# Patient Record
Sex: Male | Born: 1985 | Hispanic: No | Marital: Single | Smoking: Current every day smoker
Health system: Southern US, Community
[De-identification: ages and names within clinical notes are randomized; demographics above are authoritative.]

---

## 2016-04-16 ENCOUNTER — Encounter (HOSPITAL_BASED_OUTPATIENT_CLINIC_OR_DEPARTMENT_OTHER): Payer: Self-pay | Admitting: *Deleted

## 2016-04-16 ENCOUNTER — Emergency Department (HOSPITAL_BASED_OUTPATIENT_CLINIC_OR_DEPARTMENT_OTHER)
Admission: EM | Admit: 2016-04-16 | Discharge: 2016-04-16 | Disposition: A | Discharge status: Home / Self Care | Payer: Managed Care, Other (non HMO) | Attending: Emergency Medicine | Admitting: Emergency Medicine

## 2016-04-16 ENCOUNTER — Emergency Department (HOSPITAL_BASED_OUTPATIENT_CLINIC_OR_DEPARTMENT_OTHER): Payer: Managed Care, Other (non HMO)

## 2016-04-16 DIAGNOSIS — F17200 Nicotine dependence, unspecified, uncomplicated: Secondary | ICD-10-CM | POA: Insufficient documentation

## 2016-04-16 DIAGNOSIS — R8299 Other abnormal findings in urine: Secondary | ICD-10-CM | POA: Insufficient documentation

## 2016-04-16 DIAGNOSIS — K6289 Other specified diseases of anus and rectum: Secondary | ICD-10-CM | POA: Diagnosis not present

## 2016-04-16 LAB — CBC WITH DIFFERENTIAL/PLATELET
BASOS ABS: 0 10*3/uL (ref 0.0–0.1)
Basophils Relative: 0 %
EOS PCT: 3 %
Eosinophils Absolute: 0.2 10*3/uL (ref 0.0–0.7)
HEMATOCRIT: 32.8 % — AB (ref 39.0–52.0)
HEMOGLOBIN: 11.4 g/dL — AB (ref 13.0–17.0)
LYMPHS ABS: 1.9 10*3/uL (ref 0.7–4.0)
LYMPHS PCT: 30 %
MCH: 20.4 pg — ABNORMAL LOW (ref 26.0–34.0)
MCHC: 34.8 g/dL (ref 30.0–36.0)
MCV: 58.8 fL — AB (ref 78.0–100.0)
Monocytes Absolute: 0.4 10*3/uL (ref 0.1–1.0)
Monocytes Relative: 7 %
NEUTROS ABS: 3.9 10*3/uL (ref 1.7–7.7)
Neutrophils Relative %: 60 %
Platelets: 175 10*3/uL (ref 150–400)
RBC: 5.58 MIL/uL (ref 4.22–5.81)
RDW: 17.8 % — AB (ref 11.5–15.5)
WBC: 6.4 10*3/uL (ref 4.0–10.5)

## 2016-04-16 LAB — URINALYSIS, ROUTINE W REFLEX MICROSCOPIC
Bilirubin Urine: NEGATIVE
GLUCOSE, UA: NEGATIVE mg/dL
Hgb urine dipstick: NEGATIVE
KETONES UR: NEGATIVE mg/dL
LEUKOCYTES UA: NEGATIVE
NITRITE: NEGATIVE
PH: 7 (ref 5.0–8.0)
Protein, ur: NEGATIVE mg/dL
SPECIFIC GRAVITY, URINE: 1.04 — AB (ref 1.005–1.030)

## 2016-04-16 LAB — BASIC METABOLIC PANEL
ANION GAP: 5 (ref 5–15)
BUN: 16 mg/dL (ref 6–20)
CO2: 28 mmol/L (ref 22–32)
Calcium: 8.7 mg/dL — ABNORMAL LOW (ref 8.9–10.3)
Chloride: 107 mmol/L (ref 101–111)
Creatinine, Ser: 1.04 mg/dL (ref 0.61–1.24)
GFR calc non Af Amer: >60 mL/min (ref >60)
GLUCOSE: 81 mg/dL (ref 65–99)
POTASSIUM: 3.6 mmol/L (ref 3.5–5.1)
Sodium: 140 mmol/L (ref 135–145)

## 2016-04-16 MED ORDER — CEFTRIAXONE SODIUM 250 MG IJ SOLR
250.0000 mg | Freq: Once | INTRAMUSCULAR | Status: AC
  Administered 2016-04-16: 250 mg via INTRAMUSCULAR
  Filled 2016-04-16: qty 250

## 2016-04-16 MED ORDER — IOPAMIDOL (ISOVUE-300) INJECTION 61%
100.0000 mL | Freq: Once | INTRAVENOUS | Status: AC | PRN
  Administered 2016-04-16: 100 mL via INTRAVENOUS

## 2016-04-16 MED ORDER — SODIUM CHLORIDE 0.9 % IV BOLUS (SEPSIS)
500.0000 mL | Freq: Once | INTRAVENOUS | Status: AC
  Administered 2016-04-16: 500 mL via INTRAVENOUS

## 2016-04-16 MED ORDER — AZITHROMYCIN 1 G PO PACK
1.0000 g | PACK | Freq: Once | ORAL | Status: AC
  Administered 2016-04-16: 1 g via ORAL
  Filled 2016-04-16: qty 1

## 2016-04-16 NOTE — ED Notes (Signed)
Patient transported to CT and returned 

## 2016-04-16 NOTE — ED Triage Notes (Signed)
States he can feel an enlarged lump inside is anus for the past week.

## 2016-04-16 NOTE — ED Notes (Signed)
Painful bump on buttocks

## 2016-04-16 NOTE — ED Provider Notes (Signed)
[facility] Note   CSN: [id number] Arrival date & time: 04/16/16  0865     History   Chief Complaint Chief Complaint  Patient presents with  . Rectal Pain    HPI Thomas Harmon is a 31 y.o. male.  HPI  Presents with concern for rectal pain for 1 week. Reports severe pain, worse with walking, sitting. Reports he feels an enlarged lump in his rectum.  Reports pain with BM.  Denies constipation, fevers, abdominal pain, nausea or vomiting.  No bloody stool or rectal bleeding.  No urinary symptoms.   History reviewed. No pertinent past medical history.  There are no active problems to display for this patient.   History reviewed. No pertinent surgical history.     Home Medications    Prior to Admission medications   Not on File    Family History No family history on file.  Social History Social History  Substance Use Topics  . Smoking status: Current Every Day Smoker  . Smokeless tobacco: Never Used  . Alcohol use Yes     Allergies   Patient has no known allergies.   Review of Systems Review of Systems  Constitutional: Negative for fever.  HENT: Negative for sore throat.   Eyes: Negative for visual disturbance.  Respiratory: Negative for shortness of breath.   Cardiovascular: Negative for chest pain.  Gastrointestinal: Positive for rectal pain. Negative for abdominal pain, anal bleeding, blood in stool, constipation, diarrhea, nausea and vomiting.  Genitourinary: Negative for difficulty urinating and dysuria.  Musculoskeletal: Negative for back pain and neck stiffness.  Skin: Negative for rash.  Neurological: Negative for syncope and headaches.     Physical Exam Updated Vital Signs BP 131/78 (BP Location: Right Arm)   Pulse 89   Temp 98.7 F (37.1 C) (Oral)   Resp 16   Ht 5\' 7"  (1.702 m)   Wt 210 lb (95.3 kg)   SpO2 99%   BMI 32.89 kg/m   Physical Exam  Constitutional: He is oriented to person, place, and time. He  appears well-developed and well-nourished. No distress.  HENT:  Head: Normocephalic and atraumatic.  Eyes: Conjunctivae and EOM are normal.  Neck: Normal range of motion.  Cardiovascular: Normal rate, regular rhythm, normal heart sounds and intact distal pulses.  Exam reveals no gallop and no friction rub.   No murmur heard. Pulmonary/Chest: Effort normal and breath sounds normal. No respiratory distress. He has no wheezes. He has no rales.  Abdominal: Soft. He exhibits no distension. There is no tenderness. There is no guarding.  Genitourinary: Rectal exam shows tenderness. Rectal exam shows no external hemorrhoid and anal tone normal. Fissure: none visualized. Prostate is tender (initially appeared to have tenderness, however pt reports pain more superficial).  Musculoskeletal: He exhibits no edema.  Neurological: He is alert and oriented to person, place, and time.  Skin: Skin is warm and dry. He is not diaphoretic.  Nursing note and vitals reviewed.    ED Treatments / Results  Labs (all labs ordered are listed, but only abnormal results are displayed) Labs Reviewed  CBC WITH DIFFERENTIAL/PLATELET - Abnormal; Notable for the following:       Result Value   Hemoglobin 11.4 (*)    HCT 32.8 (*)    MCV 58.8 (*)    MCH 20.4 (*)    RDW 17.8 (*)    All other components within normal limits  BASIC METABOLIC PANEL - Abnormal; Notable for the following:    Calcium  8.7 (*)    All other components within normal limits  URINALYSIS, ROUTINE W REFLEX MICROSCOPIC - Abnormal; Notable for the following:    Specific Gravity, Urine 1.040 (*)    All other components within normal limits  URINE CULTURE  GC/CHLAMYDIA PROBE AMP [facility]    EKG  EKG Interpretation None       Radiology Ct Pelvis W Contrast  Result Date: 04/16/2016 CLINICAL DATA:  31 year old male with rectal pain. EXAM: CT PELVIS WITH CONTRAST TECHNIQUE: Multidetector CT imaging of the pelvis was performed  using the standard protocol following the bolus administration of intravenous contrast. CONTRAST:  100mL ISOVUE-300 IOPAMIDOL (ISOVUE-300) INJECTION 61% COMPARISON:  None. FINDINGS: Urinary Tract:  No abnormality visualized. Bowel: Visualized bound is unremarkable. No definite bowel wall thickening is noted. No discrete perirectal abscess is identified. Vascular/Lymphatic: No pathologically enlarged lymph nodes. No significant vascular abnormality seen. Reproductive:  No mass or other significant abnormality Other:  None. Musculoskeletal: No suspicious bone lesions identified. IMPRESSION: Unremarkable exam. No definite bowel wall thickening or abscess. Consider clinical follow-up. Electronically Signed   By: Harmon PierJeffrey  Hu M.D.   On: 04/16/2016 13:55    Procedures Procedures (including critical care time)  Medications Ordered in ED Medications  sodium chloride 0.9 % bolus 500 mL (0 mLs Intravenous Stopped 04/16/16 1434)  iopamidol (ISOVUE-300) 61 % injection 100 mL (100 mLs Intravenous Contrast Given 04/16/16 1338)  cefTRIAXone (ROCEPHIN) injection 250 mg (250 mg Intramuscular Given 04/16/16 1435)  azithromycin (ZITHROMAX) powder 1 g (1 g Oral Given 04/16/16 1434)     Initial Impression / Assessment and Plan / ED Course  I have reviewed the triage vital signs and the nursing notes.  Pertinent labs & imaging results that were available during my care of the patient were reviewed by me and considered in my medical decision making (see chart for details).    31yo male presents with concern for rectal pain.  No sign of hemorrhoid on exam, history not consistent with fissure, no rectal bleeding or constipation, however possible small fissure not visualized.  Patient with rectal tenderness on exam, appeared more distal, concern for sensation of swelling and CT pelvis done to evaluate for perirectal abscess. No urinary symptoms.  Pt appeared to have prostatic tenderness on exam and was initially covered for  possible prostatitis in male less than 35 with empiric STI treatment, however on further discussion, feels that pain is more superficial.  No sign of perirectal abscess, no sign of hernia on my exam, possible anal fissure not visualized.  Recommend sitz baths, avoidance of constipation. Patient discharged in stable condition with understanding of reasons to return.   Final Clinical Impressions(s) / ED Diagnoses   Final diagnoses:  Rectal pain, rule out anal fissure    New Prescriptions There are no discharge medications for this patient.    Alvira MondayErin Tajia Szeliga, MD 04/16/16 2217

## 2016-04-17 LAB — URINE CULTURE: CULTURE: NO GROWTH

## 2017-12-03 IMAGING — CT CT PELVIS W/ CM
2 of 3 series · 16 of 46 positions shown, 18 images · IV contrast (iopamidol)
Comparison: None.

CLINICAL DATA: 30-year-old male with rectal pain.

EXAM:
CT PELVIS WITH CONTRAST
TECHNIQUE: Multidetector CT imaging of the pelvis was performed using the
standard protocol following the bolus administration of intravenous
contrast.
CONTRAST:  100mL ZSXWYB-C77 IOPAMIDOL (ZSXWYB-C77) INJECTION 61%

[Series 5: axial soft tissue · axial · 0.90mm/px · z∈[-359,-87]mm · 13 of 158 slices shown, 15 images]
[im 11/158  soft-tissue]
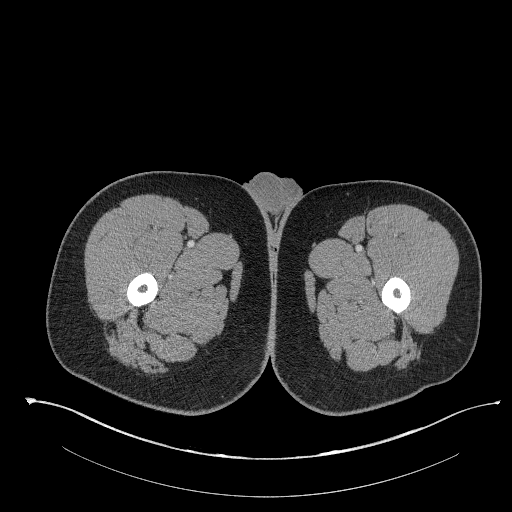
[im 11/158  bone]
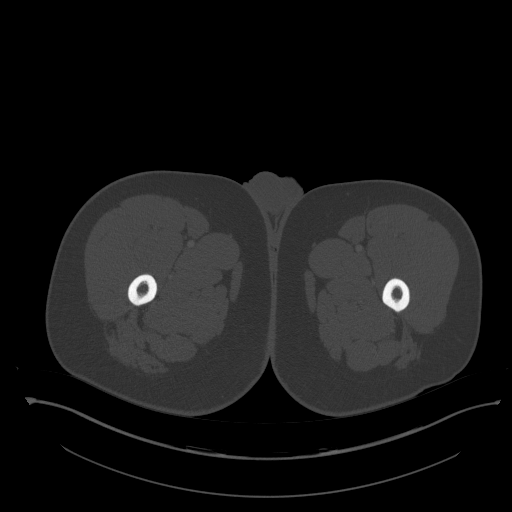
[im 21/158  soft-tissue]
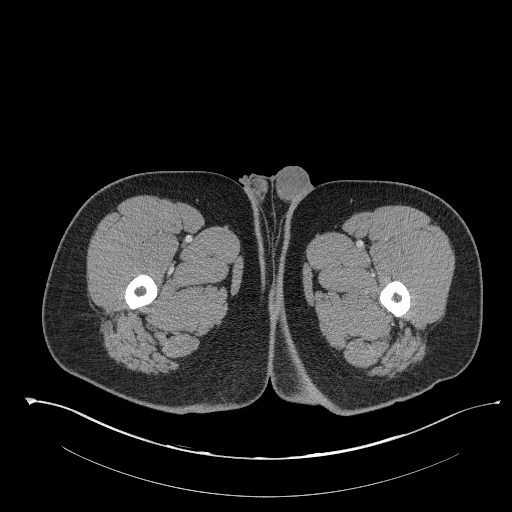
[im 31/158  soft-tissue]
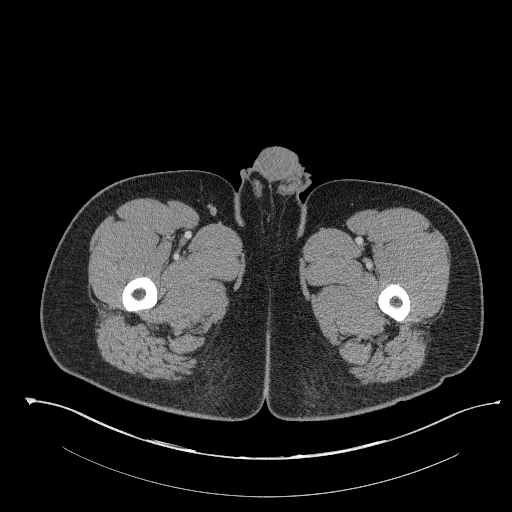
[im 46/158  soft-tissue]
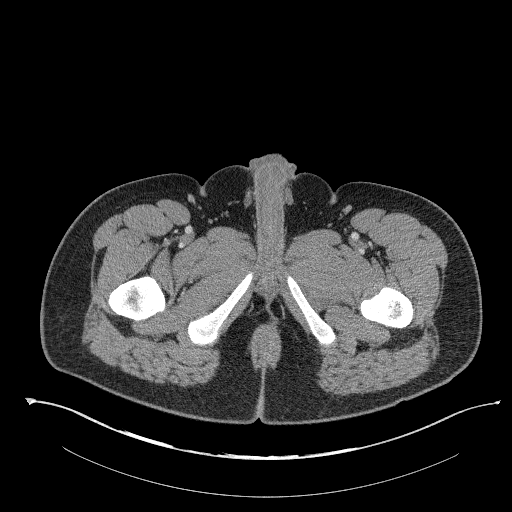
[im 56/158  soft-tissue]
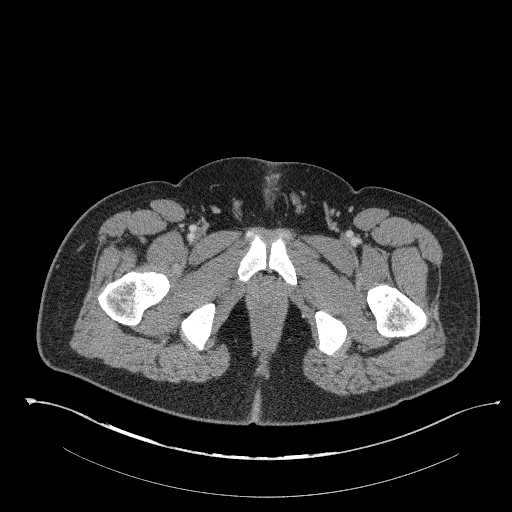
[im 66/158  soft-tissue]
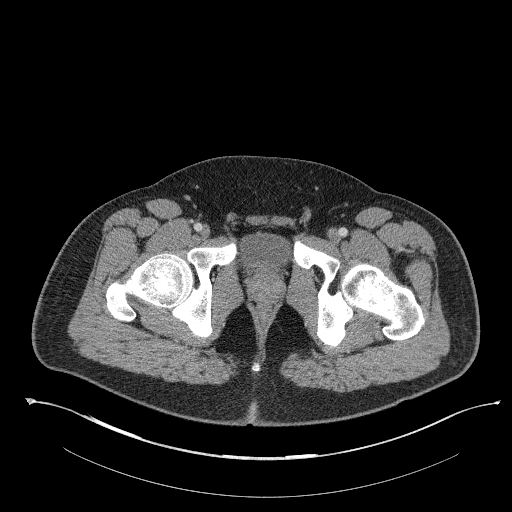
[im 82/158  soft-tissue]
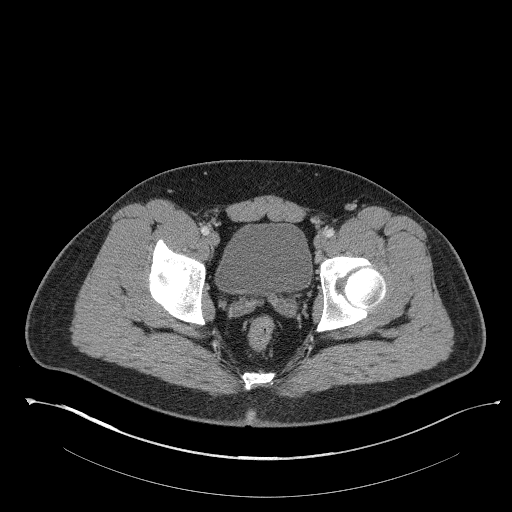
[im 92/158  soft-tissue]
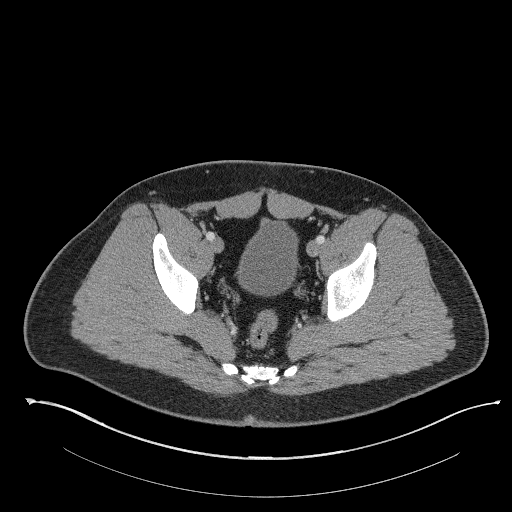
[im 102/158  soft-tissue]
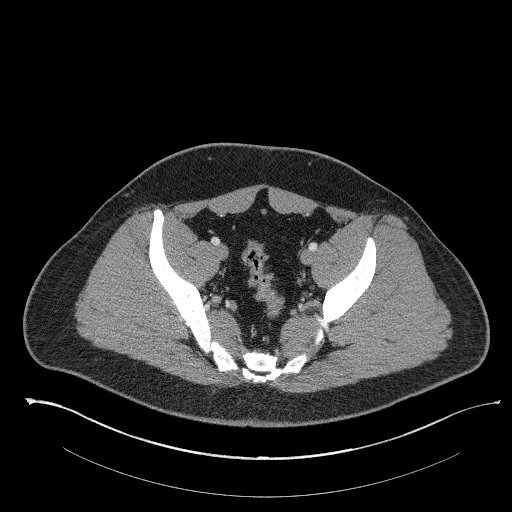
[im 102/158  bone]
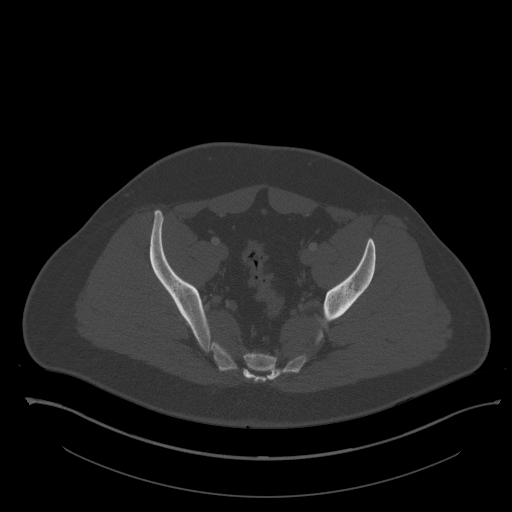
[im 112/158  soft-tissue]
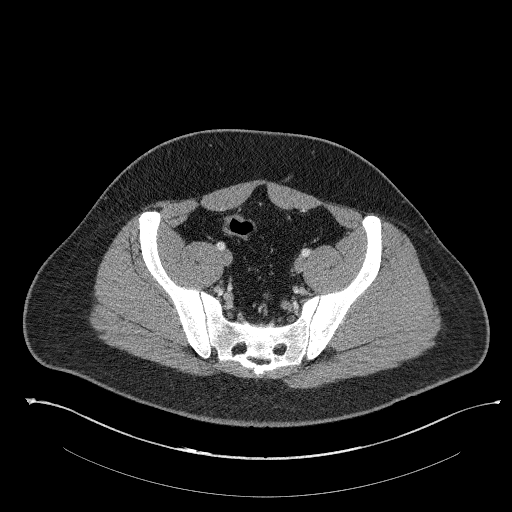
[im 127/158  soft-tissue]
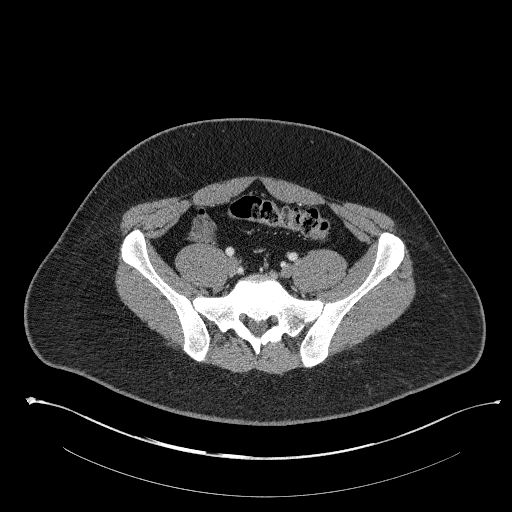
[im 137/158  soft-tissue]
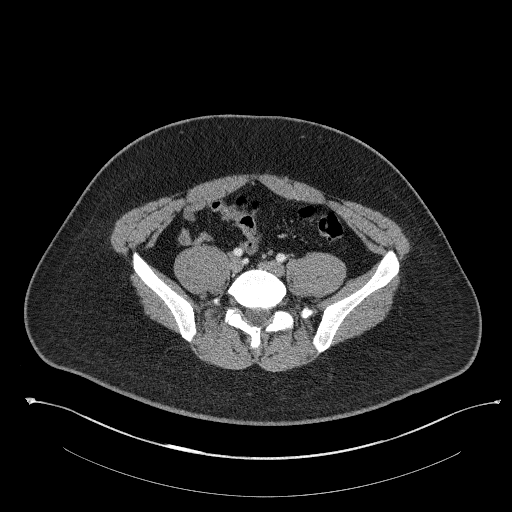
[im 147/158  soft-tissue]
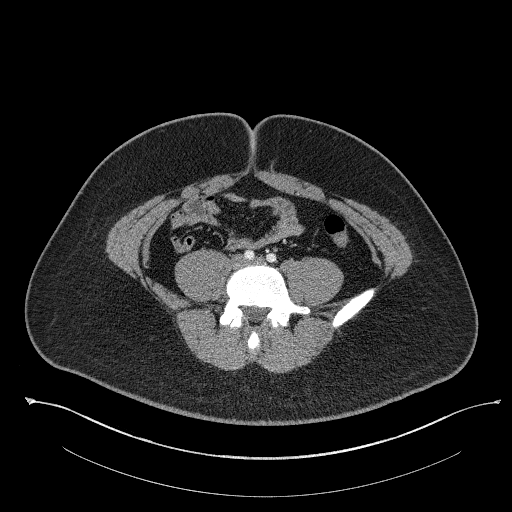

[Series 8: coronal st · coronal · 0.62mm/px · 3 of 123 slices shown]
[im 41/123  soft-tissue]
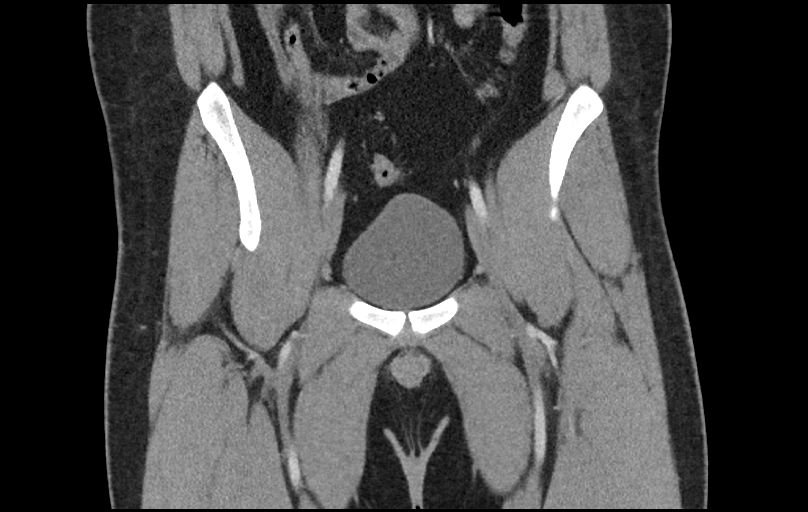
[im 55/123  soft-tissue]
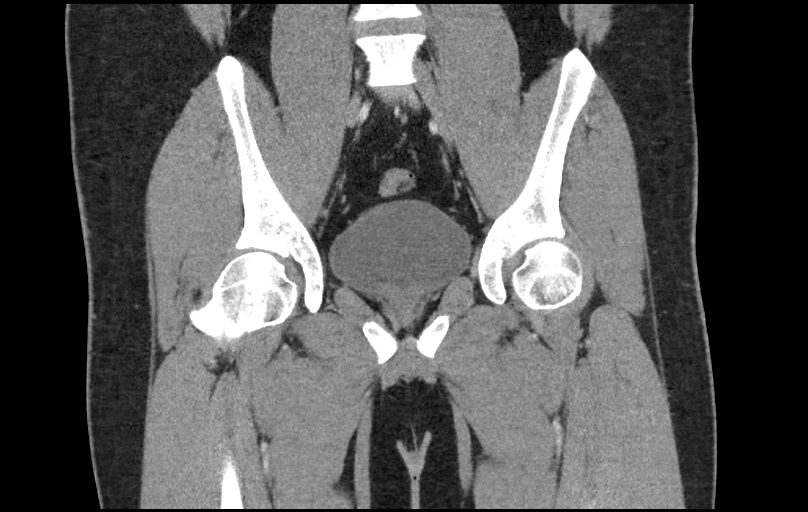
[im 68/123  soft-tissue]
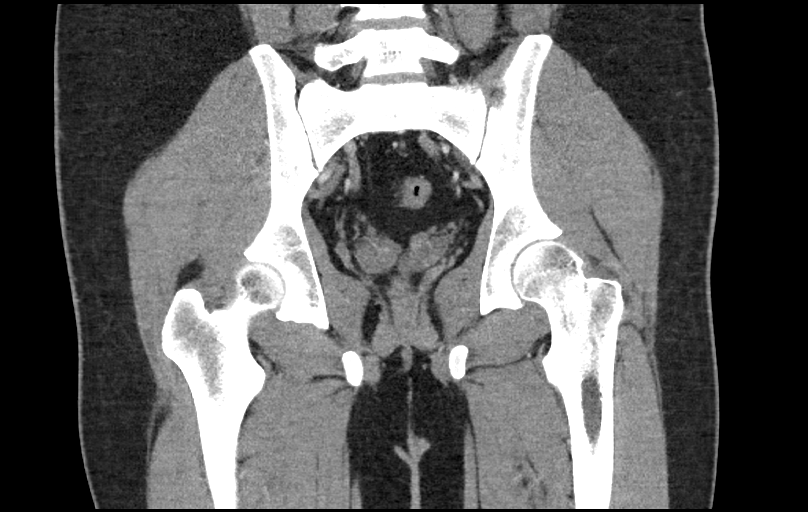

[16 of 46 positions shown; findings below may reference images not displayed]

FINDINGS: Urinary Tract:  No abnormality visualized.

Bowel: Visualized bound is unremarkable. No definite bowel wall
thickening is noted. No discrete perirectal abscess is identified.

Vascular/Lymphatic: No pathologically enlarged lymph nodes. No
significant vascular abnormality seen.

Reproductive:  No mass or other significant abnormality

Other:  None.

Musculoskeletal: No suspicious bone lesions identified.
IMPRESSION: Unremarkable exam. No definite bowel wall thickening or abscess.
Consider clinical follow-up.
